# Patient Record
Sex: Male | Born: 1985 | Race: White | Hispanic: Yes | Marital: Married | Smoking: Never smoker
Health system: Southern US, Community
[De-identification: ages and names within clinical notes are randomized; demographics above are authoritative.]

## PROBLEM LIST (undated history)

## (undated) HISTORY — PX: APPENDECTOMY: SHX54

## (undated) HISTORY — PX: OTHER SURGICAL HISTORY: SHX169

---

## 2014-04-13 ENCOUNTER — Emergency Department (HOSPITAL_BASED_OUTPATIENT_CLINIC_OR_DEPARTMENT_OTHER)
Admission: EM | Admit: 2014-04-13 | Discharge: 2014-04-13 | Disposition: A | Discharge status: Home / Self Care | Payer: Medicaid Other | Attending: Emergency Medicine | Admitting: Emergency Medicine

## 2014-04-13 ENCOUNTER — Emergency Department (HOSPITAL_BASED_OUTPATIENT_CLINIC_OR_DEPARTMENT_OTHER): Payer: Medicaid Other

## 2014-04-13 ENCOUNTER — Encounter (HOSPITAL_BASED_OUTPATIENT_CLINIC_OR_DEPARTMENT_OTHER): Payer: Self-pay | Admitting: Emergency Medicine

## 2014-04-13 DIAGNOSIS — M549 Dorsalgia, unspecified: Secondary | ICD-10-CM | POA: Insufficient documentation

## 2014-04-13 DIAGNOSIS — J309 Allergic rhinitis, unspecified: Secondary | ICD-10-CM | POA: Insufficient documentation

## 2014-04-13 DIAGNOSIS — R079 Chest pain, unspecified: Secondary | ICD-10-CM | POA: Insufficient documentation

## 2014-04-13 DIAGNOSIS — R0602 Shortness of breath: Secondary | ICD-10-CM | POA: Insufficient documentation

## 2014-04-13 DIAGNOSIS — Z79899 Other long term (current) drug therapy: Secondary | ICD-10-CM | POA: Insufficient documentation

## 2014-04-13 DIAGNOSIS — Z9109 Other allergy status, other than to drugs and biological substances: Secondary | ICD-10-CM

## 2014-04-13 MED ORDER — CETIRIZINE HCL 10 MG PO CAPS: 10.0000 mg | ORAL_CAPSULE | Freq: Every day | ORAL | Status: AC

## 2014-04-13 MED ORDER — ALBUTEROL SULFATE HFA 108 (90 BASE) MCG/ACT IN AERS
2.0000 | INHALATION_SPRAY | Freq: Four times a day (QID) | RESPIRATORY_TRACT | Status: DC
  Administered 2014-04-13: 2 via RESPIRATORY_TRACT
  Filled 2014-04-13: qty 6.7

## 2014-04-13 MED ORDER — IPRATROPIUM-ALBUTEROL 0.5-2.5 (3) MG/3ML IN SOLN
3.0000 mL | RESPIRATORY_TRACT | Status: DC
  Administered 2014-04-13: 3 mL via RESPIRATORY_TRACT
  Filled 2014-04-13: qty 3

## 2014-04-13 NOTE — ED Notes (Addendum)
MD at bedside. 

## 2014-04-13 NOTE — ED Notes (Signed)
Intermittent difficulty breathing since December.  Has been seen Urgent Care and recommended "allergy pills" and Flonase and he continues to feel congested, have back pain x 3 weeks, cough, and congestion. Treated for a sinus infection in February.

## 2014-04-13 NOTE — Discharge Instructions (Signed)
Rinitis alrgica (Allergic Rhinitis) La rinitis alrgica ocurre cuando las membranas mucosas de la nariz responden a los alrgenos. Los alrgenos son las partculas que estn en el aire y que hacen que el cuerpo tenga una reaccin Counselling psychologistalrgica. Esto hace que usted libere anticuerpos alrgicos. A travs de una cadena de eventos, estos finalmente hacen que usted libere histamina en la corriente sangunea. Aunque la funcin de la histamina es proteger al organismo, es esta liberacin de histamina lo que provoca malestar, como los estornudos frecuentes, la congestin y goteo y Control and instrumentation engineerpicazn nasales.  CAUSAS  La causa de la rinitis Merchandiser, retailalrgica estacional (fiebre del heno) son los alrgenos del polen que pueden provenir del csped, los rboles y Theme park managerla maleza. La causa de la rinitis IT consultantalrgica permanente (rinitis alrgica perenne) son los alrgenos como los caros del polvo domstico, la caspa de las mascotas y las esporas del moho.  SNTOMAS   Secrecin nasal (congestin).  Goteo y picazn nasales con estornudos y Arboriculturistlagrimeo. DIAGNSTICO  Su mdico puede ayudarlo a Warehouse managerdeterminar el alrgeno o los alrgenos que desencadenan sus sntomas. Si usted y su mdico no pueden Chief Strategy Officerdeterminar cul es el alrgeno, pueden hacerse anlisis de sangre o estudios de la piel. TRATAMIENTO  La rinitis alrgica no tiene Arubacura, pero puede controlarse mediante lo siguiente:  Medicamentos y vacunas contra la alergia (inmunoterapia).  Prevencin del alrgeno. La fiebre del heno a menudo puede tratarse con antihistamnicos en las formas de pldoras o aerosol nasal. Los antihistamnicos bloquean los efectos de la histamina. Existen medicamentos de venta libre que pueden ayudar con la congestin nasal y la hinchazn alrededor de los ojos. Consulte a su mdico antes de tomar o administrarse este medicamento.  Si la prevencin del alrgeno o el medicamento recetado no dan resultado, existen muchos medicamentos nuevos que su mdico puede recetarle. Pueden  usarse medicamentos ms fuertes si las medidas iniciales no son efectivas. Pueden aplicarse inyecciones desensibilizantes si los medicamentos y la prevencin no funcionan. La desensibilizacin ocurre cuando un paciente recibe vacunas constantes hasta que el cuerpo se vuelve menos sensible al alrgeno. Asegrese de Medical sales representativerealizar un seguimiento con su mdico si los problemas continan. INSTRUCCIONES PARA EL CUIDADO EN EL HOGAR No es posible evitar por completo los alrgenos, pero puede reducir los sntomas al tomar medidas para limitar su exposicin a ellos. Es muy til saber exactamente a qu es alrgico para que pueda evitar sus desencadenantes especficos. SOLICITE ATENCIN MDICA SI:   Lance Mussiene fiebre.  Desarrolla una tos que no se detiene fcilmente (persistente).  Le falta el aire.  Comienza a tener sibilancias.  Los sntomas interfieren con las actividades diarias normales. Document Released: 07/11/2005 Document Revised: 07/22/2013 [facility] Patient Information 2015 [place], [place]. This information is not intended to replace advice given to you by your health care provider. Make sure you discuss any questions you have with your health care provider.  Use albuterol inhaler 2 puffs every 6 hours for at least the next week. Then as needed. Return for any new or worse symptoms. Also recommended she try 0 check an allergy medicine once a day for the next 2 weeks. Refills provided if it's helpful continue it.

## 2014-04-13 NOTE — ED Provider Notes (Signed)
CSN: [id number]     Arrival date & time 04/13/14  0906 History   First MD Initiated Contact with Patient 04/13/14 0913     Chief Complaint  Patient presents with  . URI     (Consider location/radiation/quality/duration/timing/severity/associated sxs/prior Treatment) Patient is a 28 y.o. male presenting with URI. The history is provided by the patient.  URI Presenting symptoms: congestion   Presenting symptoms: no fever   Associated symptoms: no headaches    patient is a history of nasal congestion at times difficulty breathing for the past 6 months. Over the past 3 weeks there's been increase in cough and some discomfort on the left lateral posterior chest wall area. Denies fevers. The patient and urgent cares for this. Has been taking antihistamine and makes him sleepy so it sounds like it may be Benadryl. Patient has also been given Flonase in the past he is no longer utilizing Flonase or antihistamine. Patient nontoxic no acute distress. Patient has no known environmental allergies.  History reviewed. No pertinent past medical history. Past Surgical History  Procedure Laterality Date  . Appendectomy    . Arm surgery     No family history on file. History  Substance Use Topics  . Smoking status: Never Smoker   . Smokeless tobacco: Not on file  . Alcohol Use: Yes     Comment: occasional    Review of Systems  Constitutional: Negative for fever.  HENT: Positive for congestion.   Eyes: Negative for visual disturbance.  Respiratory: Positive for shortness of breath.   Cardiovascular: Positive for chest pain.  Gastrointestinal: Negative for nausea, vomiting and abdominal pain.  Genitourinary: Negative for dysuria.  Musculoskeletal: Positive for back pain.  Skin: Negative for rash.  Neurological: Negative for headaches.  Hematological: Does not bruise/bleed easily.  Psychiatric/Behavioral: Negative for confusion.      Allergies  Review of patient's allergies indicates no  known allergies.  Home Medications   Prior to Admission medications   Medication Sig Start Date End Date Taking? Authorizing Provider  Cetirizine HCl 10 MG CAPS Take 1 capsule (10 mg total) by mouth daily. 04/13/14   Vanetta MuldersScott Zackowski, MD   BP 124/76  Pulse 63  Temp(Src) 98.7 F (37.1 C) (Oral)  Resp 16  Ht 5\' 1"  (1.549 m)  Wt 130 lb (58.968 kg)  BMI 24.58 kg/m2  SpO2 100% Physical Exam  Nursing note and vitals reviewed. Constitutional: He is oriented to person, place, and time. He appears well-developed and well-nourished. No distress.  HENT:  Head: Normocephalic and atraumatic.  Mouth/Throat: Oropharynx is clear and moist.  Eyes: Conjunctivae and EOM are normal. Pupils are equal, round, and reactive to light.  Neck: Normal range of motion.  Cardiovascular: Normal rate and regular rhythm.   No murmur heard. Pulmonary/Chest: Effort normal and breath sounds normal. No respiratory distress. He has no wheezes.  Abdominal: Soft. Bowel sounds are normal. There is no tenderness.  Musculoskeletal: Normal range of motion.  Neurological: He is alert and oriented to person, place, and time. No cranial nerve deficit. He exhibits normal muscle tone. Coordination normal.  Skin: Skin is warm. No rash noted.    ED Course  Procedures (including critical care time) Labs Review Labs Reviewed - No data to display  Imaging Review Dg Chest 2 View  04/13/2014   CLINICAL DATA:  Shortness of breath this morning  EXAM: CHEST  2 VIEW  COMPARISON:  None.  FINDINGS: The heart size and mediastinal contours are within normal limits. Both  lungs are clear. The visualized skeletal structures are unremarkable.  IMPRESSION: No active cardiopulmonary disease.   Electronically Signed   By: Elige KoHetal  Patel   On: 04/13/2014 09:53     EKG Interpretation None      MDM   Final diagnoses:  Environmental allergies    Patient's history seems to be consistent with some sort of environmental allergy. Will treat  with albuterol inhaler and antihistamine. Patient was significant improvement with albuterol Atrovent nebulizer in the emergency department. Patient nontoxic no acute distress. No significant hypoxia. Patient was not wheezing but he did get improvement with the nebulizer.    Vanetta MuldersScott Zackowski, MD 04/13/14 1006

## 2015-03-12 ENCOUNTER — Encounter (HOSPITAL_BASED_OUTPATIENT_CLINIC_OR_DEPARTMENT_OTHER): Payer: Self-pay

## 2015-03-12 ENCOUNTER — Emergency Department (HOSPITAL_BASED_OUTPATIENT_CLINIC_OR_DEPARTMENT_OTHER)
Admission: EM | Admit: 2015-03-12 | Discharge: 2015-03-12 | Disposition: A | Discharge status: Home / Self Care | Payer: Self-pay | Attending: Emergency Medicine | Admitting: Emergency Medicine

## 2015-03-12 ENCOUNTER — Emergency Department (HOSPITAL_BASED_OUTPATIENT_CLINIC_OR_DEPARTMENT_OTHER): Payer: Self-pay

## 2015-03-12 DIAGNOSIS — Z9049 Acquired absence of other specified parts of digestive tract: Secondary | ICD-10-CM | POA: Insufficient documentation

## 2015-03-12 DIAGNOSIS — R1012 Left upper quadrant pain: Secondary | ICD-10-CM | POA: Insufficient documentation

## 2015-03-12 DIAGNOSIS — R140 Abdominal distension (gaseous): Secondary | ICD-10-CM | POA: Insufficient documentation

## 2015-03-12 DIAGNOSIS — R109 Unspecified abdominal pain: Secondary | ICD-10-CM

## 2015-03-12 DIAGNOSIS — R110 Nausea: Secondary | ICD-10-CM | POA: Insufficient documentation

## 2015-03-12 DIAGNOSIS — Z79899 Other long term (current) drug therapy: Secondary | ICD-10-CM | POA: Insufficient documentation

## 2015-03-12 LAB — COMPREHENSIVE METABOLIC PANEL
ALK PHOS: 61 U/L (ref 38–126)
ALT: 22 U/L (ref 17–63)
ANION GAP: 6 (ref 5–15)
AST: 25 U/L (ref 15–41)
Albumin: 4.6 g/dL (ref 3.5–5.0)
BILIRUBIN TOTAL: 1 mg/dL (ref 0.3–1.2)
BUN: 16 mg/dL (ref 6–20)
CO2: 28 mmol/L (ref 22–32)
CREATININE: 1.1 mg/dL (ref 0.61–1.24)
Calcium: 8.8 mg/dL — ABNORMAL LOW (ref 8.9–10.3)
Chloride: 101 mmol/L (ref 101–111)
Glucose, Bld: 115 mg/dL — ABNORMAL HIGH (ref 65–99)
POTASSIUM: 3.6 mmol/L (ref 3.5–5.1)
SODIUM: 135 mmol/L (ref 135–145)
Total Protein: 8.5 g/dL — ABNORMAL HIGH (ref 6.5–8.1)

## 2015-03-12 LAB — URINALYSIS, ROUTINE W REFLEX MICROSCOPIC
Bilirubin Urine: NEGATIVE
Glucose, UA: NEGATIVE mg/dL
Hgb urine dipstick: NEGATIVE
Ketones, ur: NEGATIVE mg/dL
LEUKOCYTES UA: NEGATIVE
Nitrite: NEGATIVE
PH: 6.5 (ref 5.0–8.0)
Protein, ur: NEGATIVE mg/dL
SPECIFIC GRAVITY, URINE: 1.017 (ref 1.005–1.030)
UROBILINOGEN UA: 1 mg/dL (ref 0.0–1.0)

## 2015-03-12 LAB — CBC WITH DIFFERENTIAL/PLATELET
BASOS PCT: 0 % (ref 0–1)
Basophils Absolute: 0 10*3/uL (ref 0.0–0.1)
EOS ABS: 0.3 10*3/uL (ref 0.0–0.7)
Eosinophils Relative: 3 % (ref 0–5)
HCT: 42.1 % (ref 39.0–52.0)
HEMOGLOBIN: 15.1 g/dL (ref 13.0–17.0)
Lymphocytes Relative: 13 % (ref 12–46)
Lymphs Abs: 1.1 10*3/uL (ref 0.7–4.0)
MCH: 31.1 pg (ref 26.0–34.0)
MCHC: 35.9 g/dL (ref 30.0–36.0)
MCV: 86.6 fL (ref 78.0–100.0)
Monocytes Absolute: 0.5 10*3/uL (ref 0.1–1.0)
Monocytes Relative: 6 % (ref 3–12)
Neutro Abs: 6.5 10*3/uL (ref 1.7–7.7)
Neutrophils Relative %: 78 % — ABNORMAL HIGH (ref 43–77)
Platelets: 164 10*3/uL (ref 150–400)
RBC: 4.86 MIL/uL (ref 4.22–5.81)
RDW: 12 % (ref 11.5–15.5)
WBC: 8.4 10*3/uL (ref 4.0–10.5)

## 2015-03-12 LAB — LIPASE, BLOOD: Lipase: 40 U/L (ref 22–51)

## 2015-03-12 MED ORDER — SUCRALFATE 1 GM/10ML PO SUSP: 1.0000 g | Freq: Three times a day (TID) | ORAL | Status: AC

## 2015-03-12 MED ORDER — RANITIDINE HCL 150 MG PO TABS: 150.0000 mg | ORAL_TABLET | Freq: Two times a day (BID) | ORAL | Status: AC

## 2015-03-12 NOTE — ED Provider Notes (Signed)
CSN: [id number]     Arrival date & time 03/12/15  1836 History  This chart was scribed for Jim Crease, MD by Evon Slack, ED Scribe. This patient was seen in room MH02/MH02 and the patient's care was started at 6:44 PM.     Chief Complaint  Patient presents with  . Abdominal Pain   Patient is a 29 y.o. male presenting with abdominal pain. The history is provided by the patient. No language interpreter was used.  Abdominal Pain Associated symptoms: nausea    HPI Comments: Haden Suder is a 29 y.o. male who presents to the Emergency Department complaining of left sided waxing and waning LUQ abdominal pain onset several months prior. Pt reports nausea and distention. Pt states that he had a BM 2 hours PTA that was loose and small. Pt states that the pain is worse after eating.   History reviewed. No pertinent past medical history. Past Surgical History  Procedure Laterality Date  . Appendectomy    . Arm surgery     No family history on file. History  Substance Use Topics  . Smoking status: Never Smoker   . Smokeless tobacco: Not on file  . Alcohol Use: Yes     Comment: occasional    Review of Systems  Gastrointestinal: Positive for nausea, abdominal pain and abdominal distention.  All other systems reviewed and are negative.   Allergies  Review of patient's allergies indicates no known allergies.  Home Medications   Prior to Admission medications   Medication Sig Start Date End Date Taking? Authorizing Provider  Cetirizine HCl 10 MG CAPS Take 1 capsule (10 mg total) by mouth daily. 04/13/14   Vanetta Mulders, MD   BP 149/89 mmHg  Pulse 74  Temp(Src) 99.2 F (37.3 C) (Oral)  Resp 18  Wt 135 lb (61.236 kg)  SpO2 100%   Physical Exam  Constitutional: He is oriented to person, place, and time. He appears well-developed and well-nourished. No distress.  HENT:  Head: Normocephalic and atraumatic.  Right Ear: Hearing normal.  Left Ear: Hearing normal.   Nose: Nose normal.  Mouth/Throat: Oropharynx is clear and moist and mucous membranes are normal.  Eyes: Conjunctivae and EOM are normal. Pupils are equal, round, and reactive to light.  Neck: Normal range of motion. Neck supple.  Cardiovascular: Regular rhythm, S1 normal and S2 normal.  Exam reveals no gallop and no friction rub.   No murmur heard. Pulmonary/Chest: Effort normal and breath sounds normal. No respiratory distress. He exhibits no tenderness.  Abdominal: Soft. Normal appearance. Bowel sounds are increased. There is no hepatosplenomegaly. There is tenderness in the left upper quadrant. There is no rebound, no guarding, no tenderness at McBurney's point and negative Murphy's sign. No hernia.  Musculoskeletal: Normal range of motion.  Neurological: He is alert and oriented to person, place, and time. He has normal strength. No cranial nerve deficit or sensory deficit. Coordination normal. GCS eye subscore is 4. GCS verbal subscore is 5. GCS motor subscore is 6.  Skin: Skin is warm, dry and intact. No rash noted. No cyanosis.  Psychiatric: He has a normal mood and affect. His speech is normal and behavior is normal. Thought content normal.  Nursing note and vitals reviewed.   ED Course  Procedures (including critical care time) DIAGNOSTIC STUDIES: Oxygen Saturation is 100% on RA, normal by my interpretation.    COORDINATION OF CARE: 6:50 PM-Discussed treatment plan with pt at bedside and pt agreed to plan.  Labs Review Labs Reviewed  CBC WITH DIFFERENTIAL/PLATELET - Abnormal; Notable for the following:    Neutrophils Relative % 78 (*)    All other components within normal limits  COMPREHENSIVE METABOLIC PANEL - Abnormal; Notable for the following:    Glucose, Bld 115 (*)    Calcium 8.8 (*)    Total Protein 8.5 (*)    All other components within normal limits  LIPASE, BLOOD  URINALYSIS, ROUTINE W REFLEX MICROSCOPIC ([facility])    Imaging Review Dg Abd Acute  W/chest  03/12/2015   CLINICAL DATA:  Left upper quadrant pain, nausea, distention  EXAM: DG ABDOMEN ACUTE W/ 1V CHEST  COMPARISON:  Chest radiographs dated 04/13/2014  FINDINGS: Lungs are clear.  No pleural effusion or pneumothorax.  The heart is normal in size.  Nonobstructive bowel gas pattern.  No evidence of free air under the diaphragm on the upright view.  Visualized osseous structures are within normal limits.  IMPRESSION: No evidence of acute cardiopulmonary disease.  No evidence of small bowel obstruction or free air.   Electronically Signed   By: Charline BillsSriyesh  Krishnan M.D.   On: 03/12/2015 19:29     EKG Interpretation None      MDM   Final diagnoses:  Abdominal pain, acute    Patient has been experiencing pain in the left upper abdomen that has been ongoing for several months. Patient reports the pain never really goes away, but worsens at times. He does report that it is worse when he eats. He has not had any hematemesis, rectal bleeding, melanotic. No associated vomiting. Examination reveals mild tenderness in the left upper quadrant, no guarding or rebound. Lab work is unremarkable. X-ray does not show any evidence of obstruction or other abnormality. Symptoms most consistent with gastritis, GERD, peptic ulcer disease, etc. Will treat with antiacid, Carafate, follow-up with GI.       Jim Creasehristopher J Dorleen Kissel, MD 03/12/15 1958

## 2015-03-12 NOTE — ED Notes (Signed)
States had BM approx 2 hours ago, sm amt, very loose

## 2015-03-12 NOTE — Discharge Instructions (Signed)
Abdominal Pain °Many things can cause abdominal pain. Usually, abdominal pain is not caused by a disease and will improve without treatment. It can often be observed and treated at home. Your health care provider will do a physical exam and possibly order blood tests and X-rays to help determine the seriousness of your pain. However, in many cases, more time must pass before a clear cause of the pain can be found. Before that point, your health care provider may not know if you need more testing or further treatment. °HOME CARE INSTRUCTIONS  °Monitor your abdominal pain for any changes. The following actions may help to alleviate any discomfort you are experiencing: °· Only take over-the-counter or prescription medicines as directed by your health care provider. °· Do not take laxatives unless directed to do so by your health care provider. °· Try a clear liquid diet (broth, tea, or water) as directed by your health care provider. Slowly move to a bland diet as tolerated. °SEEK MEDICAL CARE IF: °· You have unexplained abdominal pain. °· You have abdominal pain associated with nausea or diarrhea. °· You have pain when you urinate or have a bowel movement. °· You experience abdominal pain that wakes you in the night. °· You have abdominal pain that is worsened or improved by eating food. °· You have abdominal pain that is worsened with eating fatty foods. °· You have a fever. °SEEK IMMEDIATE MEDICAL CARE IF:  °· Your pain does not go away within 2 hours. °· You keep throwing up (vomiting). °· Your pain is felt only in portions of the abdomen, such as the right side or the left lower portion of the abdomen. °· You pass bloody or black tarry stools. °MAKE SURE YOU: °· Understand these instructions.   °· Will watch your condition.   °· Will get help right away if you are not doing well or get worse.   °Document Released: 07/11/2005 Document Revised: 10/06/2013 Document Reviewed: 06/10/2013 °ExitCare® Patient Information  ©2015 Exit[employer], LLC. This information is not intended to replace advice given to you by your health care provider. Make sure you discuss any questions you have with your health care provider. ° °Gastroesophageal Reflux Disease, Adult °Gastroesophageal reflux disease (GERD) happens when acid from your stomach flows up into the esophagus. When acid comes in contact with the esophagus, the acid causes soreness (inflammation) in the esophagus. Over time, GERD may create small holes (ulcers) in the lining of the esophagus. °CAUSES  °· Increased body weight. This puts pressure on the stomach, making acid rise from the stomach into the esophagus. °· Smoking. This increases acid production in the stomach. °· Drinking alcohol. This causes decreased pressure in the lower esophageal sphincter (valve or ring of muscle between the esophagus and stomach), allowing acid from the stomach into the esophagus. °· Late evening meals and a full stomach. This increases pressure and acid production in the stomach. °· A malformed lower esophageal sphincter. °Sometimes, no cause is found. °SYMPTOMS  °· Burning pain in the lower part of the mid-chest behind the breastbone and in the mid-stomach area. This may occur twice a week or more often. °· Trouble swallowing. °· Sore throat. °· Dry cough. °· Asthma-like symptoms including chest tightness, shortness of breath, or wheezing. °DIAGNOSIS  °Your caregiver may be able to diagnose GERD based on your symptoms. In some cases, X-rays and other tests may be done to check for complications or to check the condition of your stomach and esophagus. °TREATMENT  °Your caregiver may   recommend over-the-counter or prescription medicines to help decrease acid production. Ask your caregiver before starting or adding any new medicines.  °HOME CARE INSTRUCTIONS  °· Change the factors that you can control. Ask your caregiver for guidance concerning weight loss, quitting smoking, and alcohol  consumption. °· Avoid foods and drinks that make your symptoms worse, such as: °¨ Caffeine or alcoholic drinks. °¨ Chocolate. °¨ Peppermint or mint flavorings. °¨ Garlic and onions. °¨ Spicy foods. °¨ Citrus fruits, such as oranges, lemons, or limes. °¨ Tomato-based foods such as sauce, chili, salsa, and pizza. °¨ Fried and fatty foods. °· Avoid lying down for the 3 hours prior to your bedtime or prior to taking a nap. °· Eat small, frequent meals instead of large meals. °· Wear loose-fitting clothing. Do not wear anything tight around your waist that causes pressure on your stomach. °· Raise the head of your bed 6 to 8 inches with wood blocks to help you sleep. Extra pillows will not help. °· Only take over-the-counter or prescription medicines for pain, discomfort, or fever as directed by your caregiver. °· Do not take aspirin, ibuprofen, or other nonsteroidal anti-inflammatory drugs (NSAIDs). °SEEK IMMEDIATE MEDICAL CARE IF:  °· You have pain in your arms, neck, jaw, teeth, or back. °· Your pain increases or changes in intensity or duration. °· You develop nausea, vomiting, or sweating (diaphoresis). °· You develop shortness of breath, or you faint. °· Your vomit is green, yellow, black, or looks like coffee grounds or blood. °· Your stool is red, bloody, or black. °These symptoms could be signs of other problems, such as heart disease, gastric bleeding, or esophageal bleeding. °MAKE SURE YOU:  °· Understand these instructions. °· Will watch your condition. °· Will get help right away if you are not doing well or get worse. °Document Released: 07/11/2005 Document Revised: 12/24/2011 Document Reviewed: 04/20/2011 °ExitCare® Patient Information ©2015 Exit[employer], LLC. This information is not intended to replace advice given to you by your health care provider. Make sure you discuss any questions you have with your health care provider. ° °

## 2015-03-12 NOTE — ED Notes (Signed)
Patient here with ongoing abdominal pain for the past few months. Describes the pain as epigastric and left sided pain, nausea with same

## 2015-03-12 NOTE — ED Notes (Signed)
MD at bedside. 

## 2016-01-27 IMAGING — DX DG ABDOMEN ACUTE W/ 1V CHEST
3 series · 3 of 3 positions shown · non-contrast
Comparison: Chest radiographs dated 04/13/2014

CLINICAL DATA: Left upper quadrant pain, nausea, distention

EXAM:
DG ABDOMEN ACUTE W/ 1V CHEST

[chest pa]
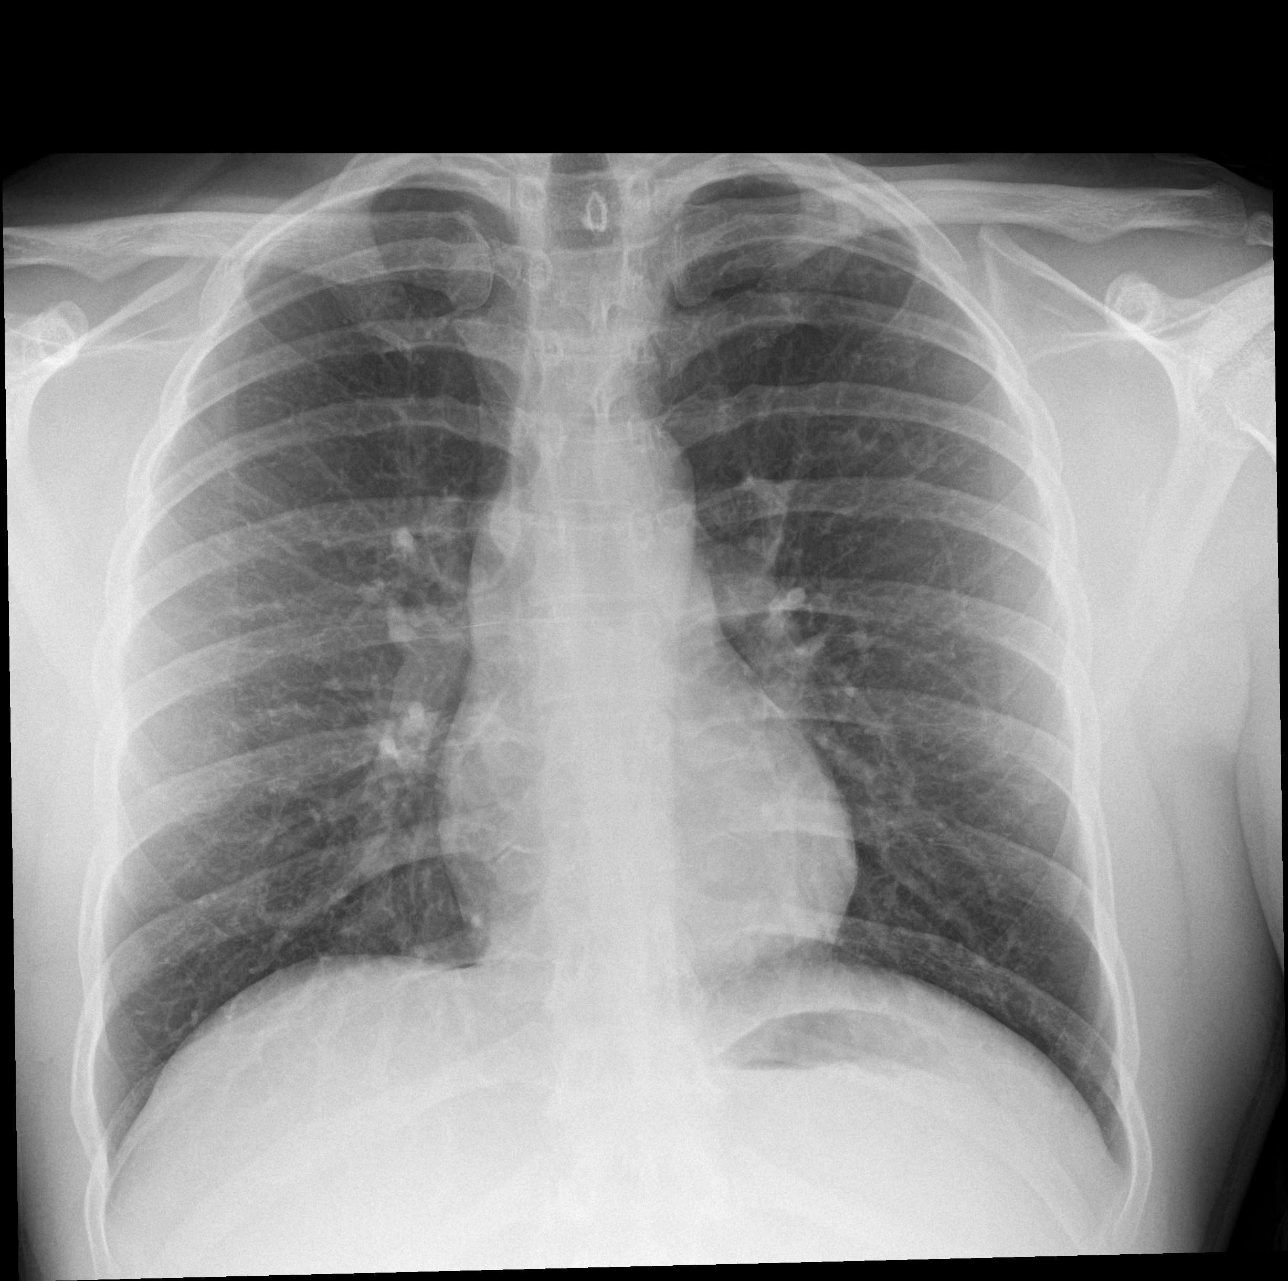

[abdomen erect]
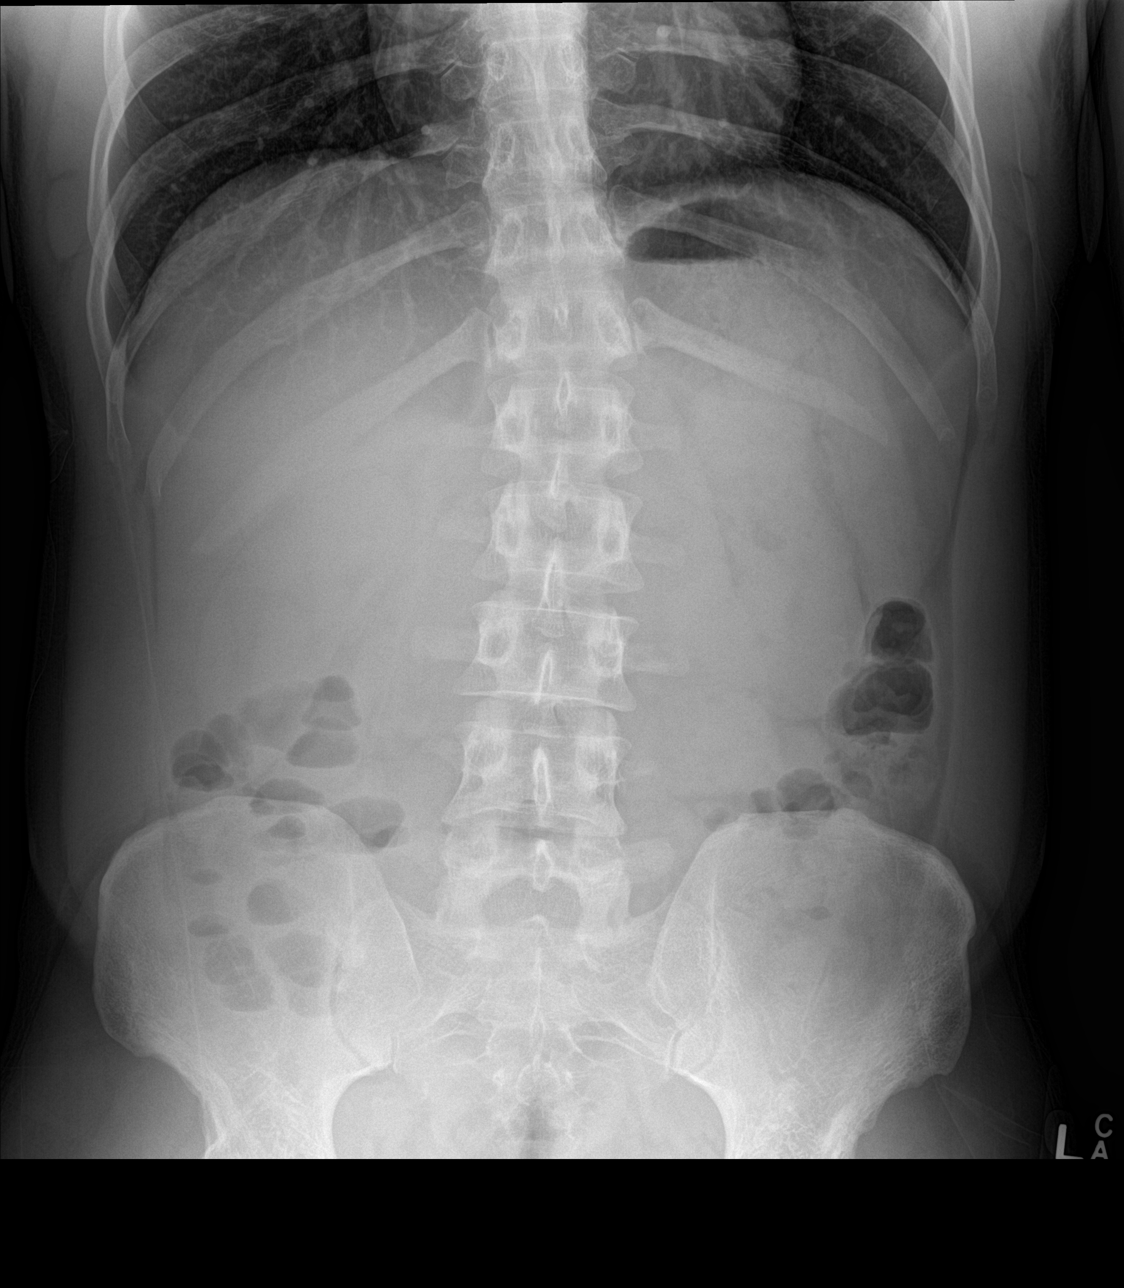

[abdomen supine]
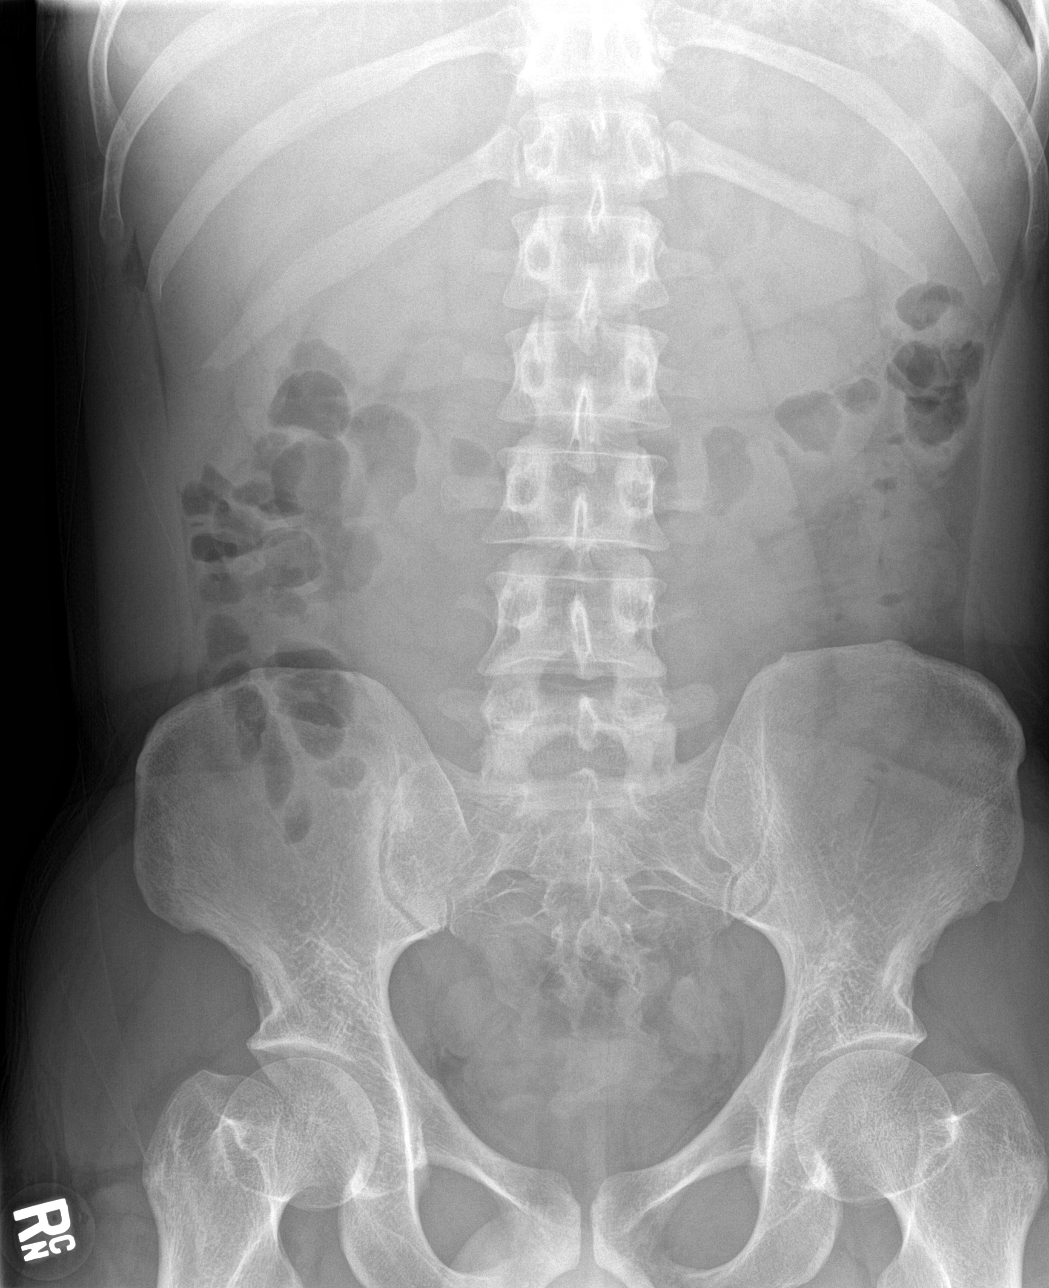

[3 of 3 positions shown; findings below may reference images not displayed]

FINDINGS: Lungs are clear.  No pleural effusion or pneumothorax.

The heart is normal in size.

Nonobstructive bowel gas pattern.

No evidence of free air under the diaphragm on the upright view.

Visualized osseous structures are within normal limits.
IMPRESSION: No evidence of acute cardiopulmonary disease.

No evidence of small bowel obstruction or free air.
